# Patient Record
Sex: Female | Born: 1965 | Race: Black or African American | Hispanic: No | Marital: Single
Health system: Midwestern US, Community
[De-identification: ages and names within clinical notes are randomized; demographics above are authoritative.]

---

## 2016-09-16 ENCOUNTER — Emergency Department: Admit: 2016-09-16 | Payer: MEDICAID | Primary: Family Medicine

## 2016-09-16 ENCOUNTER — Inpatient Hospital Stay
Admit: 2016-09-16 | Discharge: 2016-09-16 | Disposition: A | Discharge status: Home / Self Care | Payer: MEDICAID | Attending: Emergency Medicine

## 2016-09-16 DIAGNOSIS — R0789 Other chest pain: Secondary | ICD-10-CM

## 2016-09-16 LAB — EKG 12-LEAD
Atrial Rate: 73 {beats}/min
Diagnosis: NORMAL
P Axis: 45 degrees
P-R Interval: 182 ms
Q-T Interval: 446 ms
QRS Duration: 116 ms
QTc Calculation (Bazett): 491 ms
R Axis: -36 degrees
T Axis: 6 degrees
Ventricular Rate: 73 {beats}/min

## 2016-09-16 LAB — D-DIMER, QUANTITATIVE: D-Dimer, Quant: 0.27 mg/L FEU (ref 0.00–0.65)

## 2016-09-16 LAB — METABOLIC PANEL, COMPREHENSIVE
A-G Ratio: 0.7 — ABNORMAL LOW (ref 1.1–2.2)
ALT (SGPT): 43 U/L (ref 12–78)
AST (SGOT): 31 U/L (ref 15–37)
Albumin: 3.3 g/dL — ABNORMAL LOW (ref 3.5–5.0)
Alk. phosphatase: 116 U/L (ref 45–117)
Anion gap: 10 mmol/L (ref 5–15)
BUN/Creatinine ratio: 14 (ref 12–20)
BUN: 15 MG/DL (ref 6–20)
Bilirubin, total: 0.6 MG/DL (ref 0.2–1.0)
CO2: 27 mmol/L (ref 21–32)
Calcium: 9.2 MG/DL (ref 8.5–10.1)
Chloride: 104 mmol/L (ref 97–108)
Creatinine: 1.06 MG/DL — ABNORMAL HIGH (ref 0.55–1.02)
GFR est AA: >60 mL/min/{1.73_m2} (ref >60)
GFR est non-AA: 55 mL/min/{1.73_m2} — ABNORMAL LOW (ref >60)
Globulin: 4.8 g/dL — ABNORMAL HIGH (ref 2.0–4.0)
Glucose: 115 mg/dL — ABNORMAL HIGH (ref 65–100)
Potassium: 3.6 mmol/L (ref 3.5–5.1)
Protein, total: 8.1 g/dL (ref 6.4–8.2)
Sodium: 141 mmol/L (ref 136–145)

## 2016-09-16 LAB — CBC WITH AUTOMATED DIFF
ABS. BASOPHILS: 0.1 10*3/uL (ref 0.0–0.1)
ABS. EOSINOPHILS: 0.3 10*3/uL (ref 0.0–0.4)
ABS. IMM. GRANS.: 0 10*3/uL (ref 0.00–0.04)
ABS. LYMPHOCYTES: 3.1 10*3/uL (ref 0.8–3.5)
ABS. MONOCYTES: 0.8 10*3/uL (ref 0.0–1.0)
ABS. NEUTROPHILS: 3.3 10*3/uL (ref 1.8–8.0)
ABSOLUTE NRBC: 0 10*3/uL (ref 0.00–0.01)
BASOPHILS: 1 % (ref 0–1)
EOSINOPHILS: 4 % (ref 0–7)
HCT: 35.9 % (ref 35.0–47.0)
HGB: 11.9 g/dL (ref 11.5–16.0)
IMMATURE GRANULOCYTES: 0 % (ref 0.0–0.5)
LYMPHOCYTES: 41 % (ref 12–49)
MCH: 27.5 PG (ref 26.0–34.0)
MCHC: 33.1 g/dL (ref 30.0–36.5)
MCV: 83.1 FL (ref 80.0–99.0)
MONOCYTES: 11 % (ref 5–13)
MPV: 11.1 FL (ref 8.9–12.9)
NEUTROPHILS: 43 % (ref 32–75)
NRBC: 0 PER 100 WBC
PLATELET: 275 10*3/uL (ref 150–400)
RBC: 4.32 M/uL (ref 3.80–5.20)
RDW: 14.9 % — ABNORMAL HIGH (ref 11.5–14.5)
WBC: 7.6 10*3/uL (ref 3.6–11.0)

## 2016-09-16 LAB — EKG, 12 LEAD, INITIAL
Atrial Rate: 73 {beats}/min
Calculated P Axis: 45 degrees
Calculated R Axis: -36 degrees
Calculated T Axis: 6 degrees
Diagnosis: NORMAL
P-R Interval: 182 ms
Q-T Interval: 446 ms
QRS Duration: 116 ms
QTC Calculation (Bezet): 491 ms
Ventricular Rate: 73 {beats}/min

## 2016-09-16 LAB — POC TROPONIN-I: Troponin-I (POC): <0.04 ng/mL (ref 0.00–0.08)

## 2016-09-16 LAB — NT-PRO BNP: NT pro-BNP: 11 PG/ML (ref 0–125)

## 2016-09-16 LAB — TROPONIN I: Troponin-I, Qt.: <0.05 ng/mL (ref <0.05)

## 2016-09-16 LAB — PROTHROMBIN TIME + INR
INR: 1.7 — ABNORMAL HIGH (ref 0.9–1.1)
Prothrombin time: 16.9 s — ABNORMAL HIGH (ref 9.0–11.1)

## 2016-09-16 LAB — D DIMER: D-dimer: 0.27 mg/L FEU (ref 0.00–0.65)

## 2016-09-16 MED ORDER — MORPHINE 2 MG/ML INJECTION
2 mg/mL | Freq: Once | INTRAMUSCULAR | Status: AC
Start: 2016-09-16 — End: 2016-09-16
  Administered 2016-09-16: 14:00:00 via INTRAVENOUS

## 2016-09-16 MED ORDER — ONDANSETRON (PF) 4 MG/2 ML INJECTION
4 mg/2 mL | INTRAMUSCULAR | Status: AC
Start: 2016-09-16 — End: 2016-09-16
  Administered 2016-09-16: 13:00:00 via INTRAVENOUS

## 2016-09-16 MED ORDER — MORPHINE 2 MG/ML INJECTION
2 mg/mL | INTRAMUSCULAR | Status: AC
Start: 2016-09-16 — End: 2016-09-16
  Administered 2016-09-16: 13:00:00 via INTRAVENOUS

## 2016-09-16 MED ORDER — ASPIRIN 81 MG CHEWABLE TAB
81 mg | ORAL | Status: AC
Start: 2016-09-16 — End: 2016-09-16
  Administered 2016-09-16: 13:00:00 via ORAL

## 2016-09-16 MED ORDER — TRAMADOL 50 MG TAB
50 mg | ORAL_TABLET | Freq: Four times a day (QID) | ORAL | 0 refills | Status: DC | PRN
Start: 2016-09-16 — End: 2019-09-01

## 2016-09-16 MED ORDER — SODIUM CHLORIDE 0.9 % IJ SYRG
Freq: Once | INTRAMUSCULAR | Status: DC
Start: 2016-09-16 — End: 2016-09-16

## 2016-09-16 MED ORDER — IOPAMIDOL 76 % IV SOLN
370 mg iodine /mL (76 %) | Freq: Once | INTRAVENOUS | Status: AC
Start: 2016-09-16 — End: 2016-09-16
  Administered 2016-09-16: 14:00:00 via INTRAVENOUS

## 2016-09-16 MED FILL — MORPHINE 2 MG/ML INJECTION: 2 mg/mL | INTRAMUSCULAR | Qty: 2

## 2016-09-16 MED FILL — CHILDREN'S ASPIRIN 81 MG CHEWABLE TABLET: 81 mg | ORAL | Qty: 4

## 2016-09-16 MED FILL — MORPHINE 2 MG/ML INJECTION: 2 mg/mL | INTRAMUSCULAR | Qty: 1

## 2016-09-16 MED FILL — ONDANSETRON (PF) 4 MG/2 ML INJECTION: 4 mg/2 mL | INTRAMUSCULAR | Qty: 2

## 2016-09-16 NOTE — ED Triage Notes (Signed)
TRIAGE NOTE: Patient here with c/o chest pains and dizziness.  Patient states that she has had symptoms since about 03:00 today.  Patient states pain in left upper chest, reports as sharp and heaviness.  Patient reports recent removal of her thyroid almost 1 month ago.  Patient states she has been on new thyroid medication and also reports being on blood thinners for a clot that was in her lung.  Patient reports difficulty sleeping for the last two weeks.

## 2016-09-16 NOTE — ED Provider Notes (Signed)
EMERGENCY DEPARTMENT HISTORY AND PHYSICAL EXAM      Date: 09/16/2016  Patient Name: Cynthia Haynes    History of Presenting Illness     Chief Complaint   Patient presents with   ??? Chest Pain     and dizziness since 03:00 today       History Provided By: Patient    HPI: Cynthia Haynes, 51 y.o. female with PMHx significant for PE, HTN, DM, and CA, presents ambulatory to the ED with cc of worsening, constant, pressure-like sharp chest pain x 2 days. Pt endorses associated dizziness. Pt denies any exacerbating or alleviating factors. Pt notes that she has a Hx of similar sxs in the past which was the result of a PE. Pt explains that she has been having worsening chest pain over the past 2 days which progressed into dizziness last night. 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 Pt reports that she had a recent thyroidectomy for CA removal, and she was taken off of her Coumadin just prior to her surgery. Pt endorses NKDA. Pt denies any previous evaluation for her current sxs. Pt denies any PMHx of HLD or cardiac disease, nor any FHx of cardiac disease. Pt denies any social Hx of tobacco use. Pt specifically denies SOB.       There are no other complaints, changes, or physical findings at this time.    PCP: Phys Other, MD        Past History     Past Medical History:  Past Medical History:   Diagnosis Date   ??? Cancer ([place])    ??? Diabetes ([place])    ??? Hypertension    ??? 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 HX GYN      hysterectomy   ??? HX OTHER SURGICAL      thyroidectomy       Family History:  History reviewed. No pertinent family history.    Social History:  Social History   Substance Use Topics   ??? Smoking status: Never Smoker   ??? Smokeless tobacco: Never Used   ??? Alcohol use No       Allergies:  No Known Allergies      Review of Systems   Review of Systems    Constitutional: Negative for chills and fever.   HENT: Negative for congestion, rhinorrhea, sneezing and sore throat.    Eyes: Negative for redness and visual disturbance.   Respiratory: Negative for shortness of breath.    Cardiovascular: Positive for chest pain. Negative for leg swelling.   Gastrointestinal: Negative for abdominal pain, nausea and vomiting.   Genitourinary: Negative for difficulty urinating and frequency.   Musculoskeletal: Negative for back pain, myalgias and neck pain.   Skin: Negative for rash.   Neurological: Positive for dizziness. Negative for syncope, weakness and headaches.   Hematological: Negative for adenopathy.   All other systems reviewed and are negative.      Physical Exam   Physical Exam   Constitutional: She is oriented to person, place, and time. She appears well-developed and well-nourished.   HENT:   Head: Normocephalic.   Mouth/Throat: Oropharynx is clear and moist.   Eyes: Conjunctivae and EOM are normal. Pupils are equal, round, and reactive to light.   Neck: Normal range of motion.  Neck supple.   Cardiovascular: Normal rate, regular rhythm, normal heart sounds and intact distal pulses.    Pulmonary/Chest: Effort normal and breath sounds normal. She has no wheezes.   Abdominal: Soft. Bowel sounds are normal. She exhibits no distension. There is no rebound.   Musculoskeletal: Normal range of motion. She exhibits no edema or deformity.   Neurological: She is alert and oriented to person, place, and time.   Skin: Skin is warm and dry.   Psychiatric: She has a normal mood and affect. Her behavior is normal. Judgment and thought content normal.       Diagnostic Study Results     Labs -     Recent Results (from the past 12 hour(s))   EKG, 12 LEAD, INITIAL    Collection Time: 09/16/16  8:30 AM   Result Value Ref Range    Ventricular Rate 73 BPM    Atrial Rate 73 BPM    P-R Interval 182 ms    QRS Duration 116 ms    Q-T Interval 446 ms    QTC Calculation (Bezet) 491 ms     Calculated P Axis 45 degrees    Calculated R Axis -36 degrees    Calculated T Axis 6 degrees    Diagnosis       Normal sinus rhythm  Left axis deviation  Left ventricular hypertrophy with QRS widening  Prolonged QT  Abnormal ECG  No previous ECGs available     CBC WITH AUTOMATED DIFF    Collection Time: 09/16/16  8:42 AM   Result Value Ref Range    WBC 7.6 3.6 - 11.0 K/uL    RBC 4.32 3.80 - 5.20 M/uL    HGB 11.9 11.5 - 16.0 g/dL    HCT 35.9 35.0 - 47.0 %    MCV 83.1 80.0 - 99.0 FL    MCH 27.5 26.0 - 34.0 PG    MCHC 33.1 30.0 - 36.5 g/dL    RDW 14.9 (H) 11.5 - 14.5 %    PLATELET 275 150 - 400 K/uL    MPV 11.1 8.9 - 12.9 FL    NRBC 0.0 0 PER 100 WBC    ABSOLUTE NRBC 0.00 0.00 - 0.01 K/uL    NEUTROPHILS 43 32 - 75 %    LYMPHOCYTES 41 12 - 49 %    MONOCYTES 11 5 - 13 %    EOSINOPHILS 4 0 - 7 %    BASOPHILS 1 0 - 1 %    IMMATURE GRANULOCYTES 0 0.0 - 0.5 %    ABS. NEUTROPHILS 3.3 1.8 - 8.0 K/UL    ABS. LYMPHOCYTES 3.1 0.8 - 3.5 K/UL    ABS. MONOCYTES 0.8 0.0 - 1.0 K/UL    ABS. EOSINOPHILS 0.3 0.0 - 0.4 K/UL    ABS. BASOPHILS 0.1 0.0 - 0.1 K/UL    ABS. IMM. GRANS. 0.0 0.00 - 0.04 K/UL    DF AUTOMATED     METABOLIC PANEL, COMPREHENSIVE    Collection Time: 09/16/16  8:42 AM   Result Value Ref Range    Sodium 141 136 - 145 mmol/L    Potassium 3.6 3.5 - 5.1 mmol/L    Chloride 104 97 - 108 mmol/L    CO2 27 21 - 32 mmol/L    Anion gap 10 5 - 15 mmol/L    Glucose 115 (H) 65 - 100 mg/dL    BUN 15 6 - 20 MG/DL    Creatinine 1.06 (H) 0.55 - 1.02 MG/DL  BUN/Creatinine ratio 14 12 - 20      GFR est AA >60 >60 ml/min/1.51m    GFR est non-AA 55 (L) >60 ml/min/1.736m   Calcium 9.2 8.5 - 10.1 MG/DL    Bilirubin, total 0.6 0.2 - 1.0 MG/DL    ALT (SGPT) 43 12 - 78 U/L    AST (SGOT) 31 15 - 37 U/L    Alk. phosphatase 116 45 - 117 U/L    Protein, total 8.1 6.4 - 8.2 g/dL    Albumin 3.3 (L) 3.5 - 5.0 g/dL    Globulin 4.8 (H) 2.0 - 4.0 g/dL    A-G Ratio 0.7 (L) 1.1 - 2.2     PROTHROMBIN TIME + INR    Collection Time: 09/16/16  8:42 AM    Result Value Ref Range    INR 1.7 (H) 0.9 - 1.1      Prothrombin time 16.9 (H) 9.0 - 11.1 sec   D DIMER    Collection Time: 09/16/16  8:42 AM   Result Value Ref Range    D-dimer 0.27 0.00 - 0.65 mg/L FEU   NT-PRO BNP    Collection Time: 09/16/16  8:42 AM   Result Value Ref Range    NT pro-BNP 11 0 - 125 PG/ML   TROPONIN I    Collection Time: 09/16/16  8:42 AM   Result Value Ref Range    Troponin-I, Qt. <0.05 <0.05 ng/mL   POC TROPONIN-I    Collection Time: 09/16/16 10:49 AM   Result Value Ref Range    Troponin-I (POC) <0.04 0.00 - 0.08 ng/mL       Radiologic Studies -     CT Results  (Last 48 hours)               09/16/16 0942  CTA CHEST W OR W WO CONT Final result    Impression:  IMPRESSION: Study is limited by patient's obesity. No definite pulmonary emboli   identified..           Narrative:  INDICATION:   Chest pain, acute, pulmonary embolism (PE) suspected       COMPARISON: None.       TECHNIQUE:    Precontrast scout images were obtained to localize the volume for acquisition.   Multislice helical CT arteriography was performed from the diaphragm to the   thoracic inlet during uneventful rapid bolus intravenous administration of 100   mL of Isovue-370. Lung and soft tissue windows were generated. Post processing   was performed and coronal reformatted images were also generated. 3 D imaging   was performed.  CT dose reduction was achieved through use of a standardized   protocol tailored for this examination and automatic exposure control for dose   modulation.        FINDINGS: Study is compromised by patient's body habitus.   Patient status post thyroidectomy       There are small cystic areas within the lungs may represent emphysema.       There is increased density more dependently most likely atelectasis.               No definite pulmonary emboli identified allowing for technique. Main pulmonary   arteries normal caliber.       There is mild cardiomegaly.    The superior mediastinum is obscured by artifact.   No definite adenopathy identified The aorta enhances normally without evidence   of aneurysm or dissection.  There is hepatic steatosis.               Medical Decision Making   I am the first provider for this patient.    I reviewed the vital signs, available nursing notes, past medical history, past surgical history, family history and social history.    Vital Signs-Reviewed the patient's vital signs.  Patient Vitals for the past 12 hrs:   Temp Pulse Resp BP SpO2   09/16/16 1000 - 63 19 103/68 99 %   09/16/16 0900 - 74 14 118/70 97 %   09/16/16 0826 98 ??F (36.7 ??C) 79 20 114/58 98 %       Pulse Oximetry Analysis - 98% on RA    Cardiac Monitor:   Rate: 78 bpm  Rhythm: Normal Sinus Rhythm      EKG interpretation: (Preliminary) 8:30  Rhythm: normal sinus rhythm; and regular . Rate (approx.): 73 bpm; Axis: left axis deviation; PR interval: normal; QRS interval: normal ; ST/T wave: normal; Other findings: left ventricular hypertrophy.  Written by Estevan Ryder, ED Scribe, as dictated by Etheleen Nicks, MD.    Records Reviewed: Nursing Notes, Old Medical Records, Previous electrocardiograms, Previous Radiology Studies and Previous Laboratory Studies    Provider Notes (Medical Decision Making):   DDx: PE, ACS, chest wall pain    ED Course:   Initial assessment performed. The patients presenting problems have been discussed, and they are in agreement with the care plan formulated and outlined with them.  I have encouraged them to ask questions as they arise throughout their visit.    Critical Care Time:   0    Disposition:  11:05 AM  The patient has been re-evaluated and is ready for discharge. Reviewed available results with patient. Counseled patient on diagnosis and care plan. Patient has expressed understanding, and all questions have been answered. Patient agrees with plan and agrees to follow up as recommended,  or return to the ED if their symptoms worsen. Discharge instructions have been provided and explained to the patient, along with reasons to return to the ED.    PLAN:  1.   Discharge Medication List as of 09/16/2016 11:05 AM      START taking these medications    Details   traMADol (ULTRAM) 50 mg tablet Take 1 Tab by mouth every six (6) hours as needed for Pain. Max Daily Amount: 200 mg. Indications: Pain, Print, Disp-10 Tab, R-0         CONTINUE these medications which have NOT CHANGED    Details   hydroCHLOROthiazide (HYDRODIURIL) 25 mg tablet Take  by mouth daily., Historical Med      levothyroxine sodium (SYNTHROID PO) Take  by mouth., Historical Med      metformin HCl (METFORMIN PO) Take  by mouth., Historical Med      warfarin sodium (WARFARIN PO) Take  by mouth., Historical Med           2.   Follow-up Information     Follow up With Details Comments Contact Info    Phys Other, MD Call  Patient can only remember the practice name and not the physician      [facility] EMERGENCY DEPT  As needed, If symptoms worsen [address]  [phone]        Return to ED if worse     Diagnosis     Clinical Impression:   1. Chest wall pain  Attestations:    This note is prepared by Angelena Form, acting as Scribe for Etheleen Nicks, MD.    Etheleen Nicks, MD: The scribe's documentation has been prepared under my direction and personally reviewed by me in its entirety. I confirm that the note above accurately reflects all work, treatment, procedures, and medical decision making performed by me.

## 2016-09-16 NOTE — ED Notes (Signed)
Pt arrives in the ED with complaints of dizziness with chest pain x 2 days.  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

## 2016-09-16 NOTE — ED Notes (Signed)
Patient given printed discharge instructions and one script(s).  Pt verbalized understanding of instructions and script(s).  Patient verbalized importance of following up with pcp.  Patient alert and oriented, in no acute distress, ambulatory with family member.

## 2016-09-16 NOTE — ED Notes (Signed)
Emergency Department Nursing Plan of Care       The Nursing Plan of Care is developed from the Nursing assessment and Emergency Department Attending provider initial evaluation.  The plan of care may be reviewed in the ???ED Provider note???.    The Plan of Care was developed with the following considerations:   Patient / Family readiness to learn indicated [id number]:verbalized understanding  Persons(s) to be included in education: patient  Barriers to Learning/Limitations:No    Signed     Rolena InfanteKatherine L Guilford    09/16/2016   8:22 AM

## 2019-09-01 ENCOUNTER — Emergency Department: Admit: 2019-09-01 | Payer: MEDICAID | Primary: Family Medicine

## 2019-09-01 ENCOUNTER — Inpatient Hospital Stay
Admit: 2019-09-01 | Discharge: 2019-09-01 | Disposition: A | Discharge status: Home / Self Care | Payer: MEDICAID | Attending: Emergency Medicine

## 2019-09-01 DIAGNOSIS — I10 Essential (primary) hypertension: Secondary | ICD-10-CM

## 2019-09-01 LAB — COMPREHENSIVE METABOLIC PANEL
ALT: 34 U/L (ref 12–78)
AST: 23 U/L (ref 15–37)
Albumin/Globulin Ratio: 0.8 — ABNORMAL LOW (ref 1.1–2.2)
Albumin: 3.5 g/dL (ref 3.5–5.0)
Alkaline Phosphatase: 104 U/L (ref 45–117)
Anion Gap: 9 mmol/L (ref 5–15)
BUN: 28 MG/DL — ABNORMAL HIGH (ref 6–20)
Bun/Cre Ratio: 18 (ref 12–20)
CO2: 28 mmol/L (ref 21–32)
Calcium: 8.9 MG/DL (ref 8.5–10.1)
Chloride: 104 mmol/L (ref 97–108)
Creatinine: 1.6 MG/DL — ABNORMAL HIGH (ref 0.55–1.02)
EGFR IF NonAfrican American: 34 mL/min/{1.73_m2} — ABNORMAL LOW (ref >60)
GFR African American: 41 mL/min/{1.73_m2} — ABNORMAL LOW (ref >60)
Globulin: 4.4 g/dL — ABNORMAL HIGH (ref 2.0–4.0)
Glucose: 102 mg/dL — ABNORMAL HIGH (ref 65–100)
Potassium: 3.6 mmol/L (ref 3.5–5.1)
Sodium: 141 mmol/L (ref 136–145)
Total Bilirubin: 0.4 MG/DL (ref 0.2–1.0)
Total Protein: 7.9 g/dL (ref 6.4–8.2)

## 2019-09-01 LAB — CBC WITH AUTO DIFFERENTIAL
Basophils %: 1 % (ref 0–1)
Basophils Absolute: 0.1 10*3/uL (ref 0.0–0.1)
Eosinophils %: 1 % (ref 0–7)
Eosinophils Absolute: 0.1 10*3/uL (ref 0.0–0.4)
Granulocyte Absolute Count: 0 10*3/uL (ref 0.00–0.04)
Hematocrit: 36.9 % (ref 35.0–47.0)
Hemoglobin: 11.8 g/dL (ref 11.5–16.0)
Immature Granulocytes: 0 % (ref 0.0–0.5)
Lymphocytes %: 28 % (ref 12–49)
Lymphocytes Absolute: 2.9 10*3/uL (ref 0.8–3.5)
MCH: 27.2 PG (ref 26.0–34.0)
MCHC: 32 g/dL (ref 30.0–36.5)
MCV: 85 FL (ref 80.0–99.0)
MPV: 10.5 FL (ref 8.9–12.9)
Monocytes %: 9 % (ref 5–13)
Monocytes Absolute: 0.9 10*3/uL (ref 0.0–1.0)
NRBC Absolute: 0 10*3/uL (ref 0.00–0.01)
Neutrophils %: 61 % (ref 32–75)
Neutrophils Absolute: 6.3 10*3/uL (ref 1.8–8.0)
Nucleated RBCs: 0 PER 100 WBC
Platelets: 294 10*3/uL (ref 150–400)
RBC: 4.34 M/uL (ref 3.80–5.20)
RDW: 14.5 % (ref 11.5–14.5)
WBC: 10.3 10*3/uL (ref 3.6–11.0)

## 2019-09-01 LAB — PROTIME-INR
INR: 1.5 — ABNORMAL HIGH (ref 0.9–1.1)
Protime: 14.4 s — ABNORMAL HIGH (ref 9.0–11.1)

## 2019-09-01 LAB — TROPONIN: Troponin I: <0.05 ng/mL (ref <0.05)

## 2019-09-01 LAB — D-DIMER, QUANTITATIVE: D-Dimer, Quant: 0.23 mg/L FEU (ref 0.00–0.65)

## 2019-09-01 LAB — APTT: aPTT: 27.3 s (ref 22.1–31.0)

## 2019-09-01 LAB — METABOLIC PANEL, COMPREHENSIVE
A-G Ratio: 0.8 — ABNORMAL LOW (ref 1.1–2.2)
ALT (SGPT): 34 U/L (ref 12–78)
AST (SGOT): 23 U/L (ref 15–37)
Albumin: 3.5 g/dL (ref 3.5–5.0)
Alk. phosphatase: 104 U/L (ref 45–117)
Anion gap: 9 mmol/L (ref 5–15)
BUN/Creatinine ratio: 18 (ref 12–20)
BUN: 28 MG/DL — ABNORMAL HIGH (ref 6–20)
Bilirubin, total: 0.4 MG/DL (ref 0.2–1.0)
CO2: 28 mmol/L (ref 21–32)
Calcium: 8.9 MG/DL (ref 8.5–10.1)
Chloride: 104 mmol/L (ref 97–108)
Creatinine: 1.6 MG/DL — ABNORMAL HIGH (ref 0.55–1.02)
GFR est AA: 41 mL/min/{1.73_m2} — ABNORMAL LOW (ref >60)
GFR est non-AA: 34 mL/min/{1.73_m2} — ABNORMAL LOW (ref >60)
Globulin: 4.4 g/dL — ABNORMAL HIGH (ref 2.0–4.0)
Glucose: 102 mg/dL — ABNORMAL HIGH (ref 65–100)
Potassium: 3.6 mmol/L (ref 3.5–5.1)
Protein, total: 7.9 g/dL (ref 6.4–8.2)
Sodium: 141 mmol/L (ref 136–145)

## 2019-09-01 LAB — PROTHROMBIN TIME + INR
INR: 1.5 — ABNORMAL HIGH (ref 0.9–1.1)
Prothrombin time: 14.4 s — ABNORMAL HIGH (ref 9.0–11.1)

## 2019-09-01 LAB — CBC WITH AUTOMATED DIFF
ABS. BASOPHILS: 0.1 10*3/uL (ref 0.0–0.1)
ABS. EOSINOPHILS: 0.1 10*3/uL (ref 0.0–0.4)
ABS. IMM. GRANS.: 0 10*3/uL (ref 0.00–0.04)
ABS. LYMPHOCYTES: 2.9 10*3/uL (ref 0.8–3.5)
ABS. MONOCYTES: 0.9 10*3/uL (ref 0.0–1.0)
ABS. NEUTROPHILS: 6.3 10*3/uL (ref 1.8–8.0)
ABSOLUTE NRBC: 0 10*3/uL (ref 0.00–0.01)
BASOPHILS: 1 % (ref 0–1)
EOSINOPHILS: 1 % (ref 0–7)
HCT: 36.9 % (ref 35.0–47.0)
HGB: 11.8 g/dL (ref 11.5–16.0)
IMMATURE GRANULOCYTES: 0 % (ref 0.0–0.5)
LYMPHOCYTES: 28 % (ref 12–49)
MCH: 27.2 PG (ref 26.0–34.0)
MCHC: 32 g/dL (ref 30.0–36.5)
MCV: 85 FL (ref 80.0–99.0)
MONOCYTES: 9 % (ref 5–13)
MPV: 10.5 FL (ref 8.9–12.9)
NEUTROPHILS: 61 % (ref 32–75)
NRBC: 0 PER 100 WBC
PLATELET: 294 10*3/uL (ref 150–400)
RBC: 4.34 M/uL (ref 3.80–5.20)
RDW: 14.5 % (ref 11.5–14.5)
WBC: 10.3 10*3/uL (ref 3.6–11.0)

## 2019-09-01 LAB — TROPONIN I: Troponin-I, Qt.: <0.05 ng/mL (ref <0.05)

## 2019-09-01 LAB — PTT: aPTT: 27.3 s (ref 22.1–31.0)

## 2019-09-01 LAB — D DIMER: D-dimer: 0.23 mg/L FEU (ref 0.00–0.65)

## 2019-09-01 MED ORDER — SODIUM CHLORIDE 0.9 % IJ SYRG
Freq: Three times a day (TID) | INTRAMUSCULAR | Status: DC
Start: 2019-09-01 — End: 2019-09-01

## 2019-09-01 MED ORDER — SODIUM CHLORIDE 0.9 % IJ SYRG
INTRAMUSCULAR | Status: DC | PRN
Start: 2019-09-01 — End: 2019-09-01
  Administered 2019-09-01: 21:00:00 via INTRAVENOUS

## 2019-09-01 MED ORDER — HYDROCODONE-ACETAMINOPHEN 5 MG-325 MG TAB
5-325 mg | ORAL | Status: AC
Start: 2019-09-01 — End: 2019-09-01
  Administered 2019-09-01: 21:00:00 via ORAL

## 2019-09-01 MED ORDER — TRAMADOL 50 MG TAB
50 mg | ORAL_TABLET | Freq: Four times a day (QID) | ORAL | 0 refills | Status: AC | PRN
Start: 2019-09-01 — End: 2019-09-04

## 2019-09-01 MED FILL — BD POSIFLUSH NORMAL SALINE 0.9 % INJECTION SYRINGE: INTRAMUSCULAR | Qty: 40

## 2019-09-01 MED FILL — HYDROCODONE-ACETAMINOPHEN 5 MG-325 MG TAB: 5-325 mg | ORAL | Qty: 1

## 2019-09-01 NOTE — ED Notes (Signed)
Reports headache starting Wednesday and chest pain starting Thursday.  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

## 2019-09-01 NOTE — ED Notes (Signed)
Pt given printed discharge instructions and 1 script(s).  Pt verbalized understanding of instructions and script(s).  Pt verbalized importance of following up with PCP.  Pt alert and oriented, in no acute distress, ambulatory with self. Pt instructed on MyChart.

## 2019-09-01 NOTE — ED Provider Notes (Signed)
ED Provider Notes by Alvie Heidelberg, MD at 09/01/19 1728                Author: Alvie Heidelberg, MD  Service: Emergency Medicine  Author Type: Physician       Filed: 09/01/19 1751  Date of Service: 09/01/19 1728  Status: Signed          Editor: Alvie Heidelberg, MD (Physician)               EMERGENCY DEPARTMENT HISTORY AND PHYSICAL EXAM           Date: 09/01/2019   Patient Name: Cynthia Haynes        History of Presenting Illness          Chief Complaint       Patient presents with        ?  Chest Pain           History Provided By: Patient      HPI: Cynthia Haynes,  54 y.o. female presents to the ED with cc of chest pain since last Thursday.  Patient presents  complaining of atypical chest discomfort since last Thursday.  Denies any associated fever, cough, pleurisy, nausea or vomiting.  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  She is on Coumadin.  She states she may have missed 2 doses of  Coumadin.  Her prior PE was related to a surgery she had.  She denies any history of coronary disease.  Her pain is rated at 6/10.  The pain is not worse with movement or deep breath.      There are no other complaints, changes, or physical findings at this time.      PCP: Other, Phys, MD        No current facility-administered medications on file prior to encounter.          Current Outpatient Medications on File Prior to Encounter          Medication  Sig  Dispense  Refill           ?  hydroCHLOROthiazide (HYDRODIURIL) 25 mg tablet  Take  by mouth daily.         ?  levothyroxine sodium (SYNTHROID PO)  Take  by mouth.         ?  metformin HCl (METFORMIN PO)  Take  by mouth.         ?  warfarin sodium (WARFARIN PO)  Take 10 mg by mouth.               ?  [DISCONTINUED] traMADol (ULTRAM) 50 mg tablet  Take 1 Tab by mouth every six (6) hours as needed for Pain. Max Daily Amount: 200 mg. Indications: Pain  10 Tab  0             Past History        Past Medical History:     Past Medical History:         Diagnosis  Date         ?  Cancer [facility])       ?  Diabetes ([place])       ?  Hypertension           ?  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  HX GYN              hysterectomy          ?  HX OTHER SURGICAL              thyroidectomy           Family History:   History reviewed. No pertinent family history.      Social History:     Social History          Tobacco Use         ?  Smoking status:  Never Smoker     ?  Smokeless tobacco:  Never Used       Substance Use Topics         ?  Alcohol use:  No         ?  Drug use:  No           Allergies:     Allergies        Allergen  Reactions         ?  Compazine [Prochlorperazine]  Itching         ?  Fentanyl  Shortness of Breath                Review of Systems     Review of Systems    Constitutional: Negative.  Negative for chills and fever.    HENT: Negative.  Negative for congestion and rhinorrhea.     Respiratory: Negative.  Negative for cough, chest tightness and wheezing.     Cardiovascular: Positive for chest pain. Negative for palpitations.    Gastrointestinal: Negative.  Negative for abdominal pain, constipation, nausea and vomiting.    Endocrine: Negative.     Genitourinary: Negative.  Negative for decreased urine volume, flank pain, hematuria and pelvic pain.    Musculoskeletal: Negative.  Negative for back pain and neck pain.    Skin: Negative.  Negative for color change, pallor and rash.    Neurological: Negative.  Negative for dizziness, seizures, weakness, numbness and headaches.    Hematological: Negative.  Negative for adenopathy.    Psychiatric/Behavioral: Negative.     All other systems reviewed and are negative.           Physical Exam     Physical Exam   Vitals reviewed.    Constitutional:        General: She is not in acute distress.     Appearance: She is well-developed. She is not diaphoretic.    HENT:       Head: Normocephalic and atraumatic.      Mouth/Throat:       Pharynx: No oropharyngeal exudate.    Eyes:       General: No scleral icterus.        Right eye: No discharge.         Left eye: No discharge.      Conjunctiva/sclera: Conjunctivae normal.      Pupils: Pupils are equal, round, and reactive to light.   Neck:       Vascular: No JVD.   Cardiovascular :       Rate and Rhythm: Normal rate and regular rhythm.      Heart sounds: Normal heart sounds. No murmur heard.   No friction rub. No gallop.    Pulmonary:       Effort: Pulmonary effort is normal. No  respiratory distress.      Breath sounds: Normal breath sounds. No stridor. No wheezing or  rales.   Chest :       Chest wall: No tenderness.   Abdominal :      General: Bowel sounds are normal. There is no distension.      Palpations: Abdomen is soft. There is no mass.      Tenderness: There is no abdominal tenderness. There is no guarding or rebound.     Musculoskeletal:      Cervical back: Normal range of motion and neck supple.    Skin:      General: Skin is warm.      Coloration: Skin is not pale.      Findings: No rash.    Neurological:       Mental Status: She is alert and oriented to person, place, and time.      Cranial Nerves: No cranial nerve deficit.      Motor: No abnormal muscle tone.      Coordination: Coordination normal.      Deep Tendon Reflexes: Reflexes normal.         5:29 PM-reviewed results with patient.  Patient's D-dimer is normal.  She is subtherapeutic with her Coumadin.  She has no other signs at this time consistent with PE.  Suspect this represents atypical chest pain.  Patient declined GI cocktail.      5:44 PM-patient states her pain is still present but somewhat improved.  We discussed her test.  She does have a normal D-dimer but I recommended that possibly we consider getting a CT just because of her history and high risk of having prior PE and she  is subtherapeutic.  She states she does not want a CT at this time and states she would return if symptoms worsen.  She is aware that if she  does have a big PE and we missed the diagnosis that could make her critically ill or she could even die.  Discussed  repeating second troponin although she has had pain since Thursday she declines and will return if symptoms worsen.        Diagnostic Study Results        Labs -         Recent Results (from the past 12 hour(s))     EKG, 12 LEAD, INITIAL          Collection Time: 09/01/19  3:48 PM         Result  Value  Ref Range            Ventricular Rate  80  BPM       Atrial Rate  80  BPM       P-R Interval  186  ms       QRS Duration  120  ms       Q-T Interval  412  ms       QTC Calculation (Bezet)  475  ms       Calculated P Axis  64  degrees       Calculated R Axis  -54  degrees       Calculated T Axis  28  degrees       Diagnosis                 Normal sinus rhythm   Left anterior fascicular block   Left ventricular hypertrophy with QRS widening   Abnormal ECG  When compared with ECG of 16-Sep-2016 08:30,   No significant change was found          CBC WITH AUTOMATED DIFF          Collection Time: 09/01/19  4:08 PM         Result  Value  Ref Range            WBC  10.3  3.6 - 11.0 K/uL       RBC  4.34  3.80 - 5.20 M/uL       HGB  11.8  11.5 - 16.0 g/dL       HCT  36.9  35.0 - 47.0 %       MCV  85.0  80.0 - 99.0 FL       MCH  27.2  26.0 - 34.0 PG       MCHC  32.0  30.0 - 36.5 g/dL       RDW  14.5  11.5 - 14.5 %       PLATELET  294  150 - 400 K/uL       MPV  10.5  8.9 - 12.9 FL       NRBC  0.0  0 PER 100 WBC       ABSOLUTE NRBC  0.00  0.00 - 0.01 K/uL       NEUTROPHILS  61  32 - 75 %       LYMPHOCYTES  28  12 - 49 %       MONOCYTES  9  5 - 13 %       EOSINOPHILS  1  0 - 7 %       BASOPHILS  1  0 - 1 %       IMMATURE GRANULOCYTES  0  0.0 - 0.5 %       ABS. NEUTROPHILS  6.3  1.8 - 8.0 K/UL       ABS. LYMPHOCYTES  2.9  0.8 - 3.5 K/UL       ABS. MONOCYTES  0.9  0.0 - 1.0 K/UL       ABS. EOSINOPHILS  0.1  0.0 - 0.4 K/UL       ABS. BASOPHILS  0.1  0.0 - 0.1 K/UL       ABS. IMM. GRANS.  0.0  0.00 - 0.04 K/UL       DF   AUTOMATED          METABOLIC PANEL, COMPREHENSIVE          Collection Time: 09/01/19  4:08 PM         Result  Value  Ref Range            Sodium  141  136 - 145 mmol/L       Potassium  3.6  3.5 - 5.1 mmol/L       Chloride  104  97 - 108 mmol/L            CO2  28  21 - 32 mmol/L            Anion gap  9  5 - 15 mmol/L       Glucose  102 (H)  65 - 100 mg/dL       BUN  28 (H)  6 - 20 MG/DL       Creatinine  1.60 (H)  0.55 - 1.02 MG/DL       BUN/Creatinine ratio  18  12 -  20         GFR est AA  41 (L)  >60 ml/min/1.90m       GFR est non-AA  34 (L)  >60 ml/min/1.780m      Calcium  8.9  8.5 - 10.1 MG/DL       Bilirubin, total  0.4  0.2 - 1.0 MG/DL       ALT (SGPT)  34  12 - 78 U/L       AST (SGOT)  23  15 - 37 U/L       Alk. phosphatase  104  45 - 117 U/L       Protein, total  7.9  6.4 - 8.2 g/dL       Albumin  3.5  3.5 - 5.0 g/dL       Globulin  4.4 (H)  2.0 - 4.0 g/dL       A-G Ratio  0.8 (L)  1.1 - 2.2         D DIMER          Collection Time: 09/01/19  4:08 PM         Result  Value  Ref Range            D-dimer  0.23  0.00 - 0.65 mg/L FEU       TROPONIN I          Collection Time: 09/01/19  4:08 PM         Result  Value  Ref Range            Troponin-I, Qt.  <0.05  <0.05 ng/mL       PROTHROMBIN TIME + INR          Collection Time: 09/01/19  4:08 PM         Result  Value  Ref Range            INR  1.5 (H)  0.9 - 1.1         Prothrombin time  14.4 (H)  9.0 - 11.1 sec       PTT          Collection Time: 09/01/19  4:08 PM         Result  Value  Ref Range            aPTT  27.3  22.1 - 31.0 sec            aPTT, therapeutic range       58.0 - 77.0 SECS           Radiologic Studies -      XR CHEST PORT       Final Result     No acute cardiopulmonary process.                      CT Results   (Last 48 hours)          None                 CXR Results   (Last 48 hours)                                    09/01/19 1642    XR CHEST PORT  Final result            Impression:    No acute cardiopulmonary process.  Narrative:    EXAM: XR CHEST PORT             HISTORY: Chest pain.             COMPARISON: CT 09/16/2016             FINDINGS: Single view(s) of the chest. The lungs are well inflated. No focal      consolidation, pleural effusion, or pneumothorax. The cardiomediastinal      silhouette is unremarkable. The visualized osseous structures are unremarkable.                                    Medical Decision Making     I am the first provider for this patient.      I reviewed the vital signs, available nursing notes, past medical history, past surgical history, family history and social history.      Vital Signs-Reviewed the patient's vital signs.   Patient Vitals for the past 12 hrs:            Temp  Pulse  Resp  BP  SpO2            09/01/19 1729  --  72  16  (!) 107/42  100 %            09/01/19 1527  98.2 ??F (36.8 ??C)  82  16  120/61  96 %           EKG interpretation: (Preliminary)   Rhythm: normal sinus rhythm; and regular . Rate (approx.): 80; Axis: normal; PR interval: normal; QRS interval: normal ; ST/T wave: normal; Other findings: left ventricular hypertrophy.      Records Reviewed: Nursing Notes, Old Medical Records, Previous Radiology Studies and  Previous Laboratory Studies      Provider Notes (Medical Decision Making):    Differential diagnosis-coronary syndrome, PE, chest wall pain, referred GI pain, biliary colic      ZOXWRUEAVW/UJWJ-54 year old obese female with history of PE and hypertension to the ER with atypical chest pain since Thursday.  Plan will be to check labs including Coumadin and D-dimer.  Patient symptoms are atypical and not related to exertion.  She  denies any pleurisy.      ED Course:    Initial assessment performed. The patients presenting problems have been discussed, and they are in agreement with the care plan formulated and outlined with them.  I have encouraged them to ask questions as they arise throughout their visit.                      Alvie Heidelberg, MD          Disposition:         DC- Adult Discharges: All of the diagnostic tests were reviewed and questions answered. Diagnosis, care plan and treatment options were discussed.  The patient understands the instructions and will follow up as directed. The patients results have been  reviewed with them.  They have been counseled regarding their diagnosis.  The patient verbally convey understanding and agreement of the signs, symptoms, diagnosis, treatment and prognosis and additionally agrees to follow up as recommended with their  PCP in 24 - 48 hours.  They also agree with the care-plan and convey that all of their questions have been answered.  I have also put together some discharge instructions for them that include: 1) educational information regarding  their diagnosis, 2)  how to care for their diagnosis at home, as well a 3) list of reasons why they would want to return to the ED prior to their follow-up appointment, should their condition change.         DISCHARGE PLAN:   1.      Current Discharge Medication List              START taking these medications          Details        traMADoL (ULTRAM) 50 mg tablet  Take 1 Tablet by mouth every six (6) hours as needed for Pain for up to 3 days. Max Daily Amount: 200 mg. Indications: pain   Qty: 10 Tablet, Refills:  0   Start date: 09/01/2019, End date:  09/04/2019          Associated Diagnoses: Chest pain, unspecified type                      2.      Follow-up Information               Follow up With  Specialties  Details  Why  [facility]    Call in 1 week  As needed  [place]              [facility] EMERGENCY DEPT  Emergency Medicine    If symptoms worsen  [place]   [phone]             3.  Return to ED if worse         Diagnosis        Clinical Impression:       1.  Chest pain, unspecified type            Attestations:      Alvie Heidelberg, MD      Please note that this dictation was completed with Dragon, the computer voice recognition software.  Quite often unanticipated grammatical, syntax, homophones, and other interpretive errors are  inadvertently transcribed by the computer software.  Please disregard these errors.  Please excuse any errors that have escaped final proofreading.  Thank you.

## 2019-09-01 NOTE — ED Notes (Signed)
Pt denies feeling any better, but reports she is ready to go. MD notified. Attending physician spoke with patient and suggested that we perform a CTA to rule out a pulmonary embolism but the patient declined.

## 2019-09-03 LAB — EKG 12-LEAD
Atrial Rate: 80 {beats}/min
Diagnosis: NORMAL
P Axis: 64 degrees
P-R Interval: 186 ms
Q-T Interval: 412 ms
QRS Duration: 120 ms
QTc Calculation (Bazett): 475 ms
R Axis: -54 degrees
T Axis: 28 degrees
Ventricular Rate: 80 {beats}/min

## 2019-09-03 LAB — EKG, 12 LEAD, INITIAL
Atrial Rate: 80 {beats}/min
Calculated P Axis: 64 degrees
Calculated R Axis: -54 degrees
Calculated T Axis: 28 degrees
Diagnosis: NORMAL
P-R Interval: 186 ms
Q-T Interval: 412 ms
QRS Duration: 120 ms
QTC Calculation (Bezet): 475 ms
Ventricular Rate: 80 {beats}/min

## 2021-06-25 IMAGING — MR MRI ELBOW LT W/O CONTRAST
5 of 8 series · 28 of 40 positions shown · non-contrast
Comparison: none

﻿Pertinent Hx:  Elbow trauma 06/10/2021.  Painful elbow.
TECHNIQUE: Images of the elbow were taken in axial, coronal and sagittal planes.  T1 and T2-weighted imaging was performed.

[Series 3: PD fat-sat · axial · 4.0mm · 0.31mm/px · z∈[-67,+68]mm · 7 of 30 slices shown (1 of 3)]
[im 1/30]
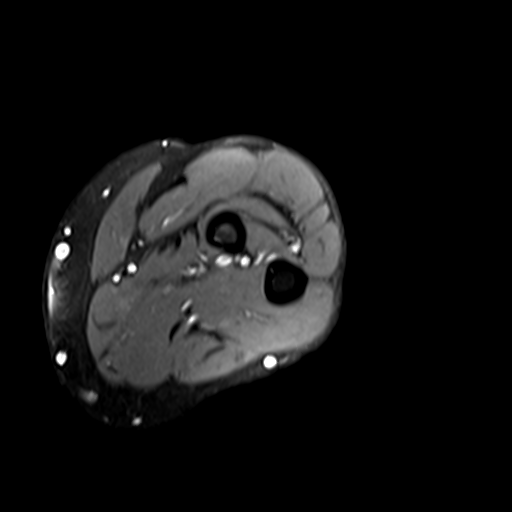
[im 5/30]
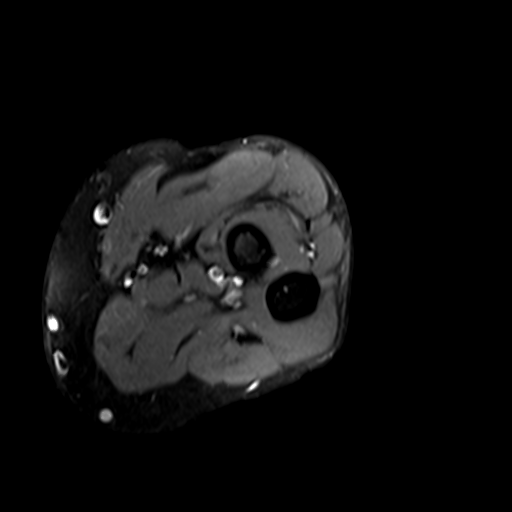
[im 10/30]
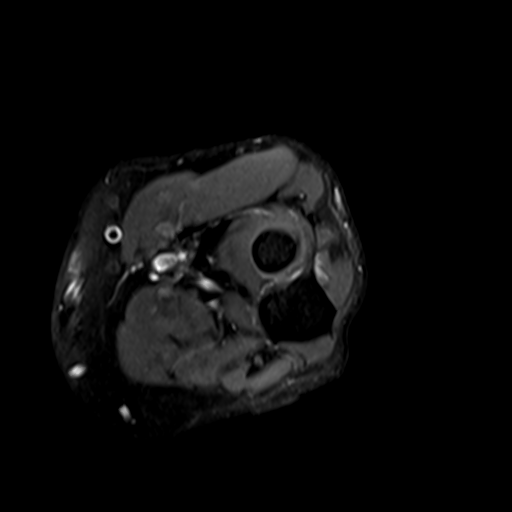
[im 15/30]
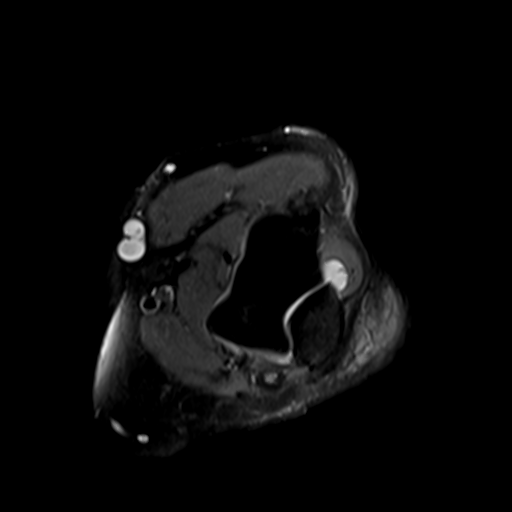
[im 20/30]
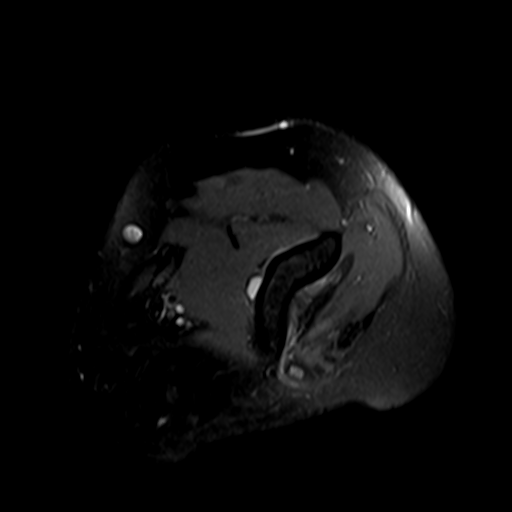
[im 25/30]
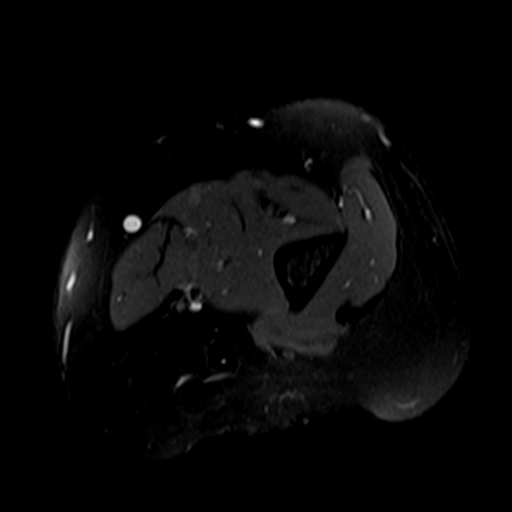
[im 30/30]
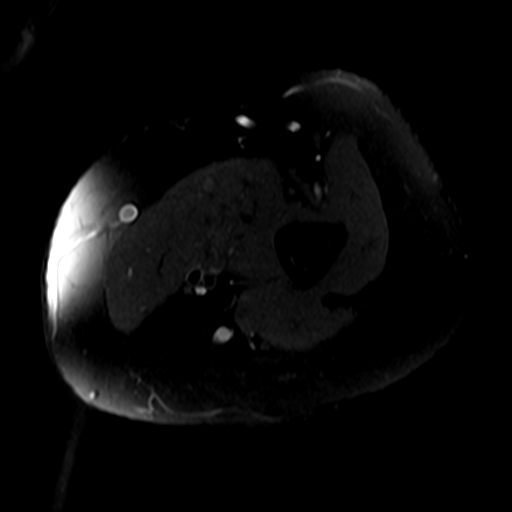

[Series 4: PD fat-sat · oblique · 3.0mm · 0.70mm/px · 5 of 23 slices shown (2 of 3)]
[im 1/23]
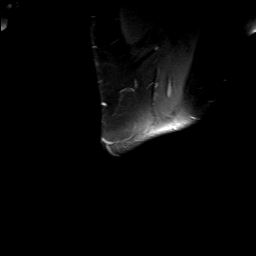
[im 6/23]
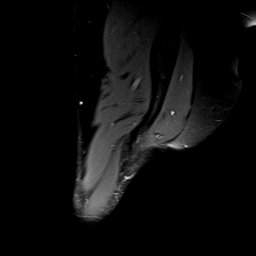
[im 12/23]
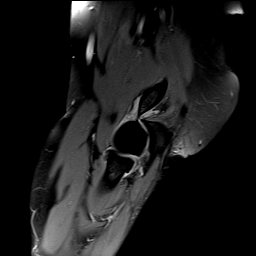
[im 17/23]
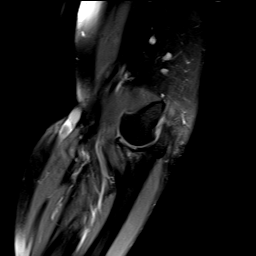
[im 23/23]
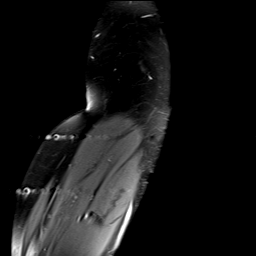

[Series 5: PD fat-sat · oblique · 4.0mm · 0.31mm/px · 4 of 19 slices shown (3 of 3)]
[im 1/19]
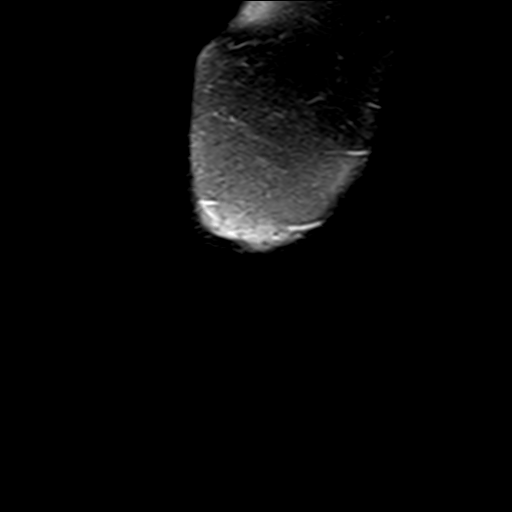
[im 7/19]
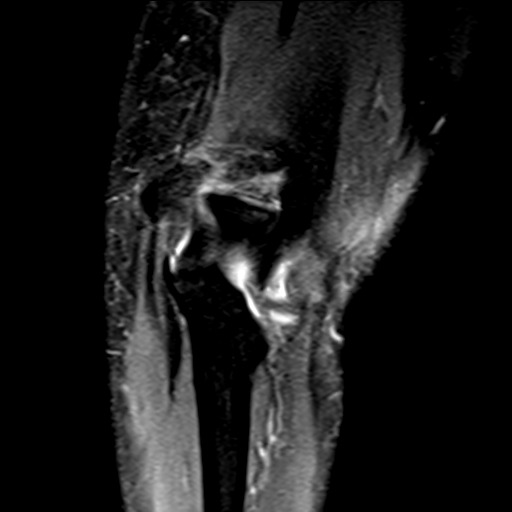
[im 13/19]
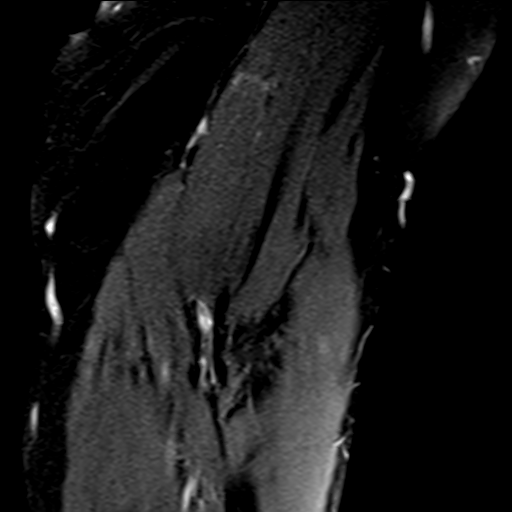
[im 19/19]
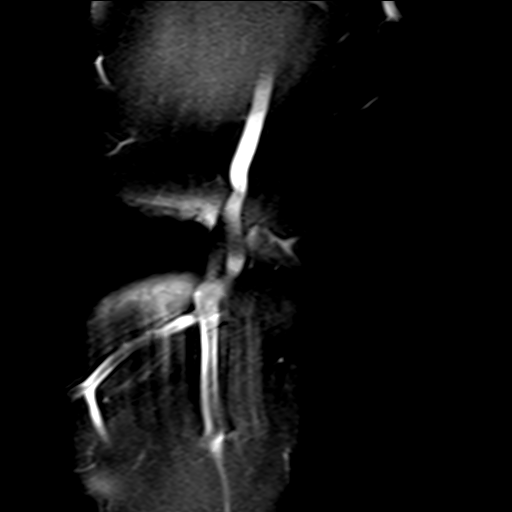

[Series 6: T2 · oblique · 3.0mm · 0.35mm/px · 5 of 23 slices shown]
[im 1/23]
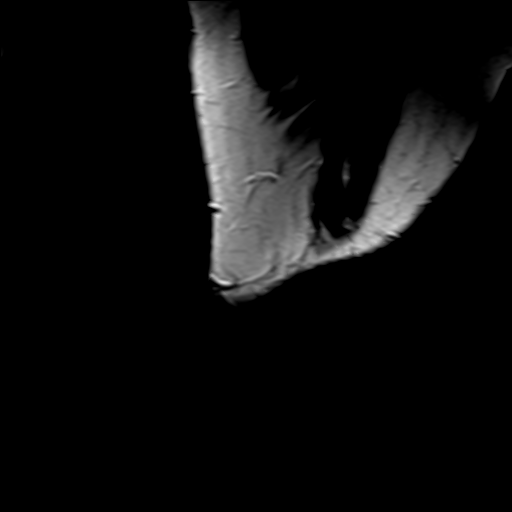
[im 6/23]
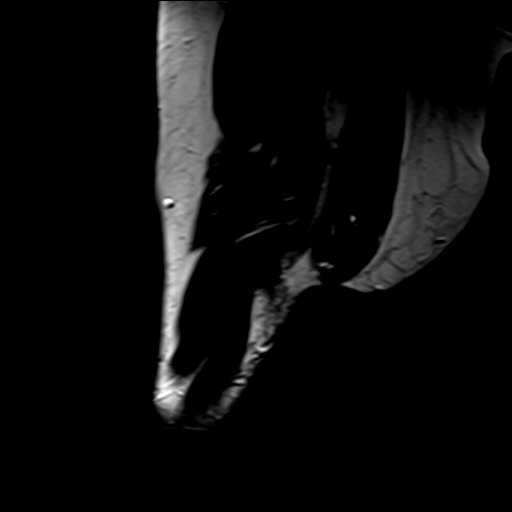
[im 12/23]
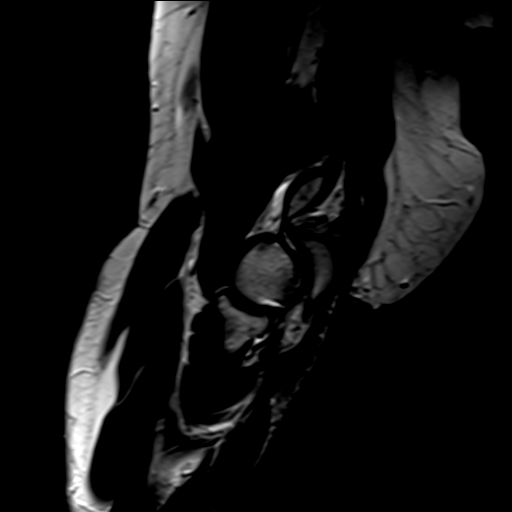
[im 17/23]
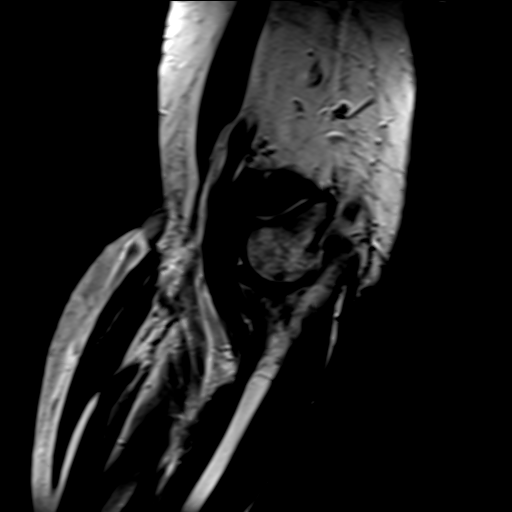
[im 23/23]
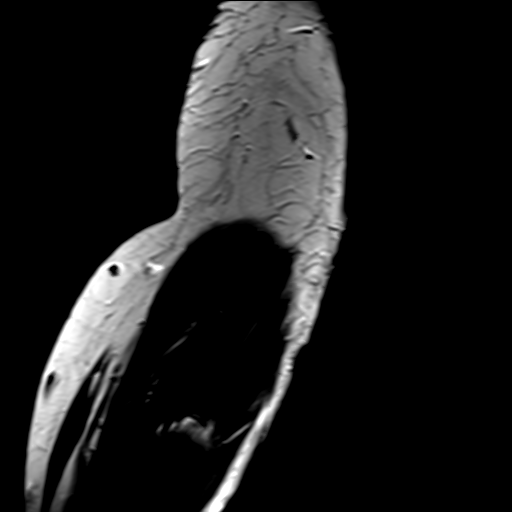

[Series 8: T1 · axial · 4.0mm · 0.62mm/px · z∈[-67,+68]mm · 7 of 30 slices shown]
[im 1/30]
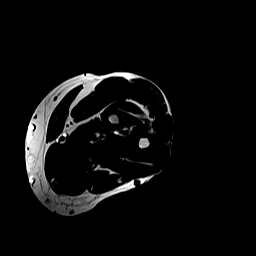
[im 5/30]
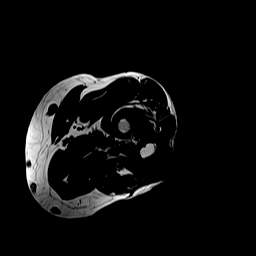
[im 10/30]
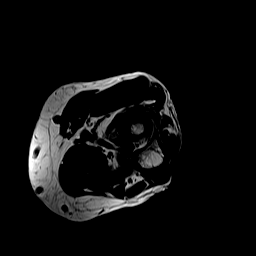
[im 15/30]
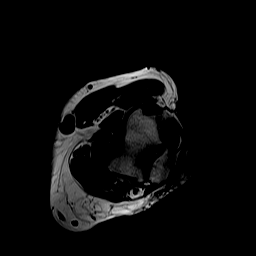
[im 20/30]
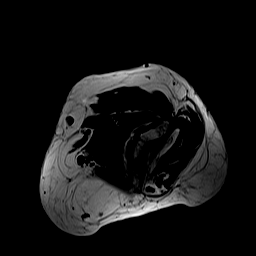
[im 25/30]
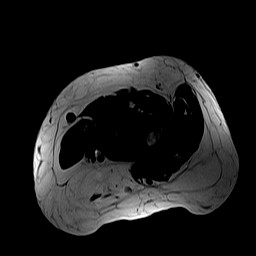
[im 30/30]
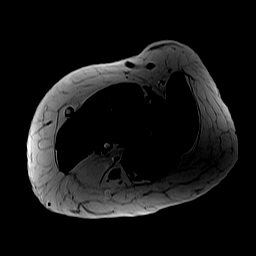

[28 of 40 positions shown; findings below may reference images not displayed]

FINDINGS: There is a moderately large elbow joint effusion.  

Bone marrow edema is present in the coronoid process of the ulna.  A few images suggest that there could be a short, nondisplaced fracture at this site although other images cannot confirm it.  

No other bony abnormality identified.  Bone marrow signal is otherwise normal.  

No ligament tear can be identified. 

No evidence of tendon tear.  

No other bone or soft tissue abnormality identified.
IMPRESSION: 1. Bone contusion and possible nondisplaced fracture in the coronoid process of the ulna.  Bone marrow edema is present and a nondisplaced fracture line is suspect on several images. 

2. Moderately large elbow joint effusion-hemarthrosis.  

3. No other abnormality identified.

## 2021-08-21 IMAGING — MR MRI SHOULDER LT W/O CONTRAST
5 series · 40 of 40 positions shown · non-contrast
Comparison: none

﻿

Pertinent Hx:    Shoulder injury.  Pain and limitation of motion.
TECHNIQUE: Proton density and T2-weighted coronal oblique images of the shoulder were taken.  Proton density sagittal oblique and axial images were also obtained.

[Series 4: PD fat-sat · axial · 4.5mm · 0.64mm/px · z∈[-59,+34]mm · 8 of 20 slices shown (1 of 4)]
[im 1/20]
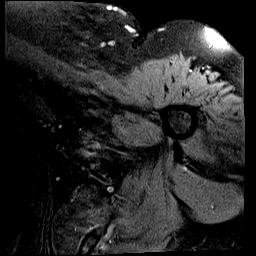
[im 3/20]
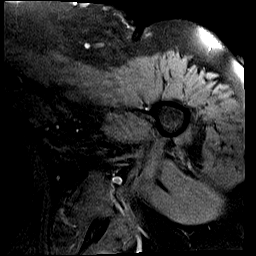
[im 6/20]
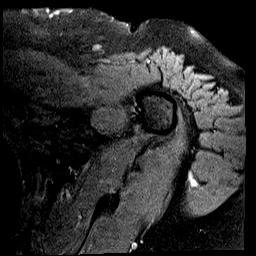
[im 9/20]
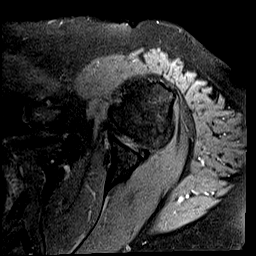
[im 11/20]
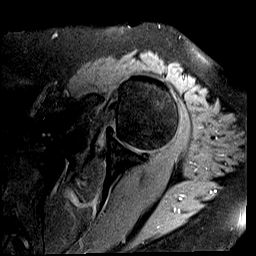
[im 14/20]
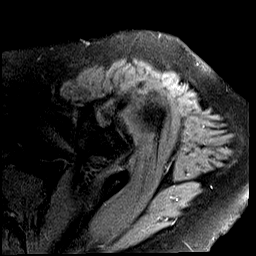
[im 17/20]
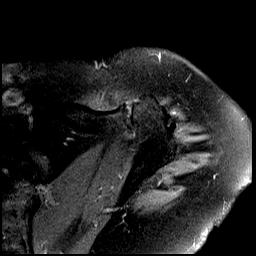
[im 20/20]
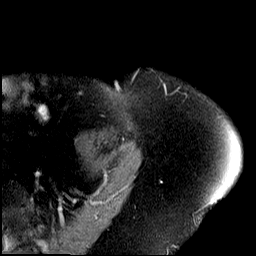

[Series 5: PD fat-sat · sagittal · 4.0mm · 0.64mm/px · 8 of 20 slices shown (2 of 4)]
[im 1/20]
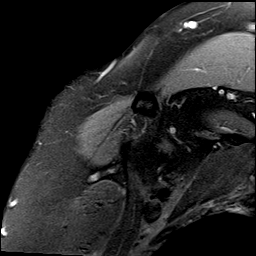
[im 3/20]
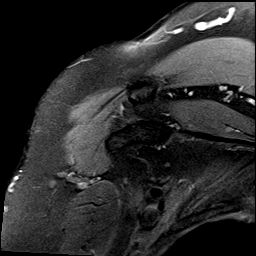
[im 6/20]
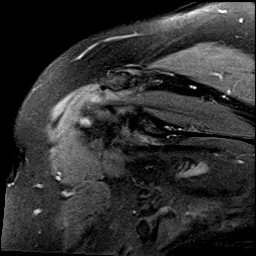
[im 9/20]
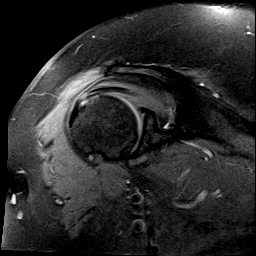
[im 11/20]
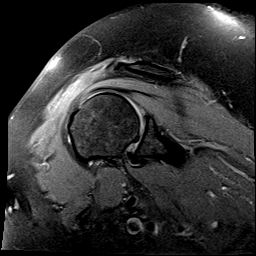
[im 14/20]
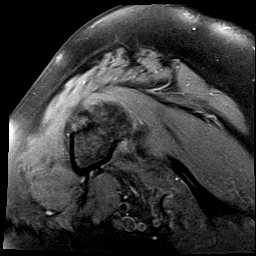
[im 17/20]
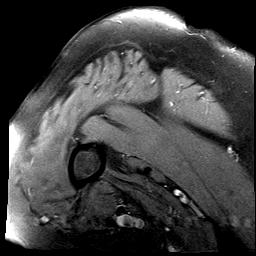
[im 20/20]
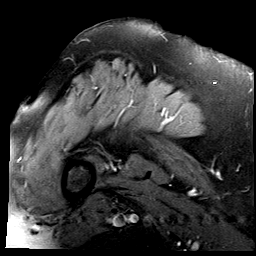

[Series 6: T2 · sagittal · 4.0mm · 0.52mm/px · 8 of 20 slices shown]
[im 1/20]
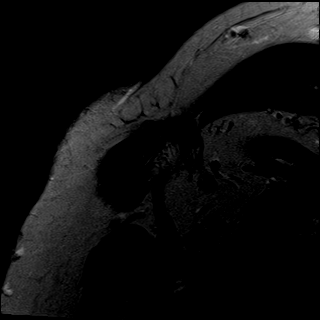
[im 3/20]
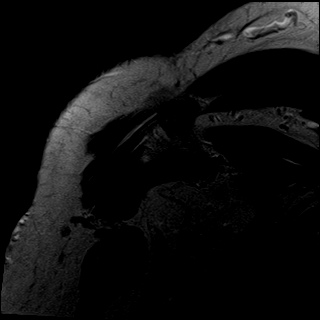
[im 6/20]
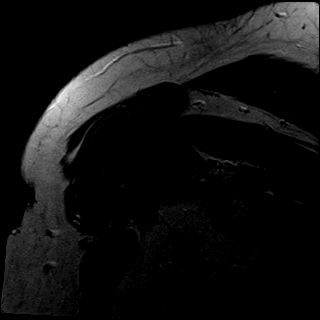
[im 9/20]
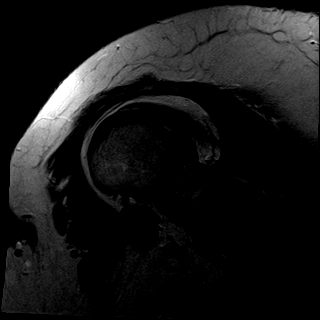
[im 11/20]
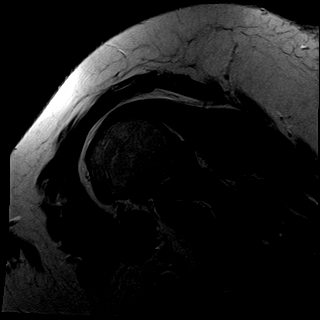
[im 14/20]
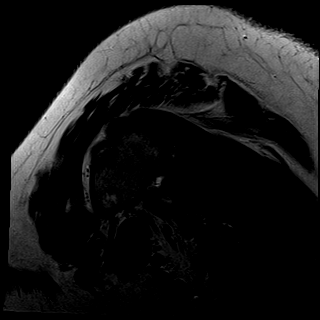
[im 17/20]
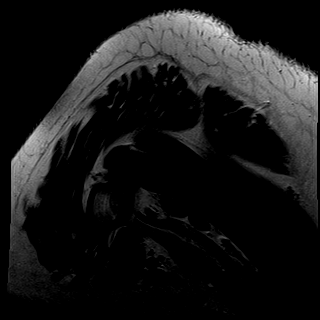
[im 20/20]
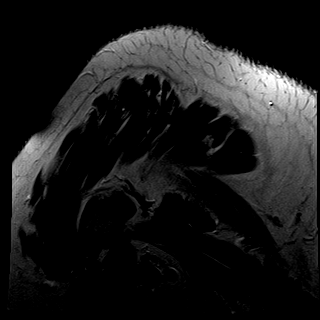

[Series 7: PD fat-sat · coronal · 4.5mm · 0.64mm/px · 8 of 20 slices shown (3 of 4)]
[im 1/20]
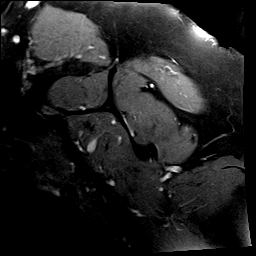
[im 3/20]
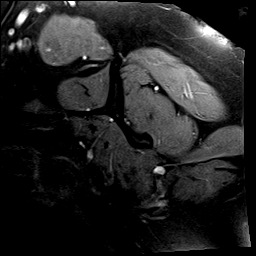
[im 6/20]
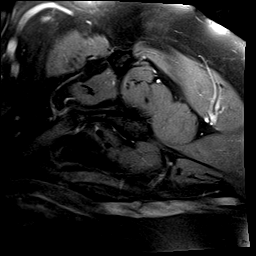
[im 9/20]
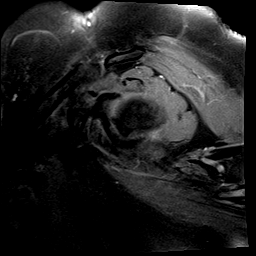
[im 11/20]
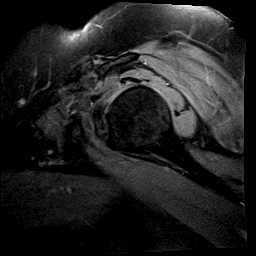
[im 14/20]
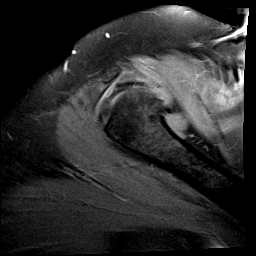
[im 17/20]
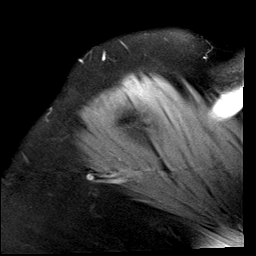
[im 20/20]
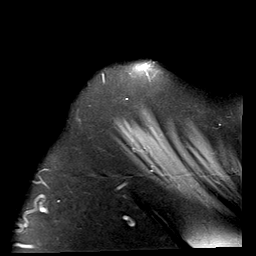

[Series 8: PD fat-sat · axial · 4.5mm · 0.64mm/px · z∈[-59,+34]mm · 8 of 20 slices shown (4 of 4)]
[im 1/20]
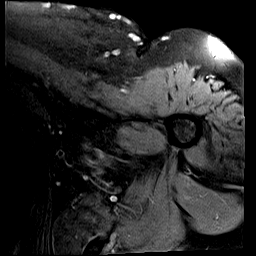
[im 3/20]
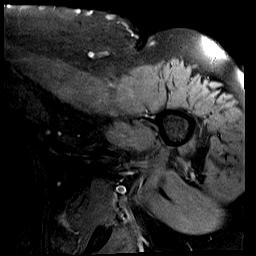
[im 6/20]
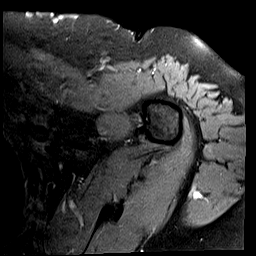
[im 9/20]
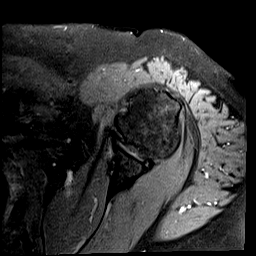
[im 11/20]
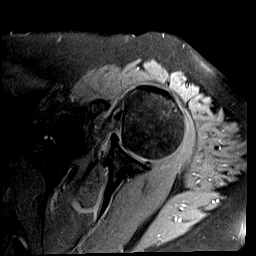
[im 14/20]
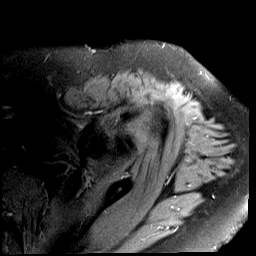
[im 17/20]
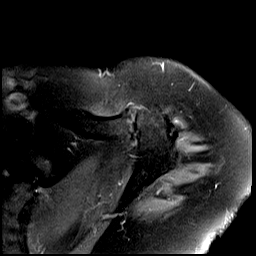
[im 20/20]
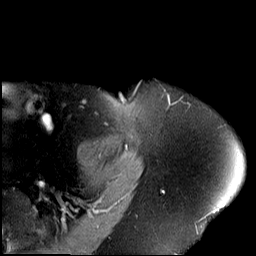

[40 of 40 positions shown; findings below may reference images not displayed]

FINDINGS: There is a tear within the supraspinatus tendon anteriorly at the humeral attachment.  The tear is likely partial thickness although it is extensive and it is possible that this could be full thickness and nondisplaced.  

There is a shoulder joint effusion.  There is minimal fluid in the subacromial-subdeltoid bursa.  

No other rotator cuff tendon tear can be identified.  

There is a tear within the substance of the posterior glenoid labrum in the mid to inferior portions of the joint.  No other labral tear can be identified.  

There is a complete tear of the tendon of the long head of the biceps.  It cannot be found in its typical intraarticular position nor in the bicipital groove.  It appears to be torn and distally retracted.  

No fracture identified.  Alignment is normal at the glenohumeral and acromioclavicular joints.  

There is mild acromioclavicular joint degenerative change.  No chondral defect at the glenohumeral joint identified.  

There is no significant muscular atrophy. 

No other abnormality identified. 

Impression on page two
IMPRESSION: 1. Rotator cuff tear.  There is a tear within the supraspinatus tendon anteriorly at the humeral attachment.  This is likely an extensive partial tear although it could be full thickness and nondisplaced.

2. Tear within the posterior glenoid labrum.

3. Complete tear of the tendon of the long head of the biceps with retraction. 

4. Shoulder joint effusion.
